# Patient Record
Sex: Male | Born: 2000 | Race: Black or African American | Hispanic: No | Marital: Single | Smoking: Current every day smoker
Health system: Southern US, Community
[De-identification: ages and names within clinical notes are randomized; demographics above are authoritative.]

## PROBLEM LIST (undated history)

## (undated) DIAGNOSIS — F4310 Post-traumatic stress disorder, unspecified: Secondary | ICD-10-CM

## (undated) DIAGNOSIS — F39 Unspecified mood [affective] disorder: Secondary | ICD-10-CM

## (undated) DIAGNOSIS — G4700 Insomnia, unspecified: Secondary | ICD-10-CM

## (undated) DIAGNOSIS — F419 Anxiety disorder, unspecified: Secondary | ICD-10-CM

## (undated) DIAGNOSIS — F909 Attention-deficit hyperactivity disorder, unspecified type: Secondary | ICD-10-CM

## (undated) DIAGNOSIS — J302 Other seasonal allergic rhinitis: Secondary | ICD-10-CM

---
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
## 2020-07-17 ENCOUNTER — Other Ambulatory Visit: Payer: Self-pay

## 2020-07-17 ENCOUNTER — Encounter (HOSPITAL_COMMUNITY): Payer: Self-pay

## 2020-07-17 ENCOUNTER — Emergency Department (HOSPITAL_COMMUNITY)
Admission: EM | Admit: 2020-07-17 | Discharge: 2020-07-17 | Disposition: A | Discharge status: Home / Self Care | Payer: Medicaid Other | Attending: Emergency Medicine | Admitting: Emergency Medicine

## 2020-07-17 DIAGNOSIS — R369 Urethral discharge, unspecified: Secondary | ICD-10-CM | POA: Insufficient documentation

## 2020-07-17 DIAGNOSIS — Z202 Contact with and (suspected) exposure to infections with a predominantly sexual mode of transmission: Secondary | ICD-10-CM | POA: Insufficient documentation

## 2020-07-17 DIAGNOSIS — F17210 Nicotine dependence, cigarettes, uncomplicated: Secondary | ICD-10-CM | POA: Diagnosis not present

## 2020-07-17 DIAGNOSIS — R300 Dysuria: Secondary | ICD-10-CM | POA: Insufficient documentation

## 2020-07-17 DIAGNOSIS — Z711 Person with feared health complaint in whom no diagnosis is made: Secondary | ICD-10-CM

## 2020-07-17 HISTORY — DX: Insomnia, unspecified: G47.00

## 2020-07-17 HISTORY — DX: Post-traumatic stress disorder, unspecified: F43.10

## 2020-07-17 LAB — URINALYSIS, ROUTINE W REFLEX MICROSCOPIC
Bilirubin Urine: NEGATIVE
Glucose, UA: NEGATIVE mg/dL
Hgb urine dipstick: NEGATIVE
Ketones, ur: NEGATIVE mg/dL
Leukocytes,Ua: NEGATIVE
Nitrite: NEGATIVE
Protein, ur: NEGATIVE mg/dL
Specific Gravity, Urine: 1.014 (ref 1.005–1.030)
pH: 6 (ref 5.0–8.0)

## 2020-07-17 MED ORDER — DOXYCYCLINE HYCLATE 100 MG PO CAPS: 100.0000 mg | ORAL_CAPSULE | Freq: Two times a day (BID) | ORAL | 0 refills | Status: AC

## 2020-07-17 MED ORDER — LIDOCAINE HCL (PF) 1 % IJ SOLN
INTRAMUSCULAR | Status: AC
  Filled 2020-07-17: qty 30

## 2020-07-17 MED ORDER — METRONIDAZOLE 500 MG PO TABS
2000.0000 mg | ORAL_TABLET | Freq: Once | ORAL | Status: AC
  Administered 2020-07-17: 2000 mg via ORAL
  Filled 2020-07-17: qty 4

## 2020-07-17 MED ORDER — CEFTRIAXONE SODIUM 1 G IJ SOLR
500.0000 mg | Freq: Once | INTRAMUSCULAR | Status: AC
  Administered 2020-07-17: 500 mg via INTRAMUSCULAR
  Filled 2020-07-17: qty 10

## 2020-07-17 NOTE — ED Triage Notes (Signed)
Patient c/o dysuria and a penile discharge yellow in color. X 2 days.

## 2020-07-17 NOTE — ED Provider Notes (Signed)
[facility]-EMERGENCY DEPT Provider Note   CSN: [id number] Arrival date & time: 07/17/20  1627     History Chief Complaint  Patient presents with  . Penile Discharge  . Dysuria    Alex Little is a 20 y.o. male.  HPI   20 year old male history of ADHD, anxiety, insomnia, mood disorder, PTSD, seasonal allergies, presents emergency department today for eval of possible STD.  States that he has had penile discharge and dysuria for the last 2 days.  He denies any penile lesions, rashes.  Denies any redness, swelling, pain to the testicles.  Denies any fevers or abdominal pain.  Has recently had unprotected intercourse and would like to be tested and treated for STDs.  Past Medical History:  Diagnosis Date  . ADHD   . Anxiety   . Insomnia   . Mood disorder (HCC)   . PTSD (post-traumatic stress disorder)   . Seasonal allergies     Patient Active Problem List   Diagnosis Date Noted  . Substance induced mood disorder (HCC) 03/01/2017    History reviewed. No pertinent surgical history.     Family History  Problem Relation Age of Onset  . Depression Mother   . Depression Father   . Schizophrenia Father     Social History   Tobacco Use  . Smoking status: Current Every Day Smoker    Packs/day: 0.25    Types: Cigarettes  . Smokeless tobacco: Never Used  Vaping Use  . Vaping Use: Some days  . Substances: Nicotine, THC, Flavoring  Substance Use Topics  . Alcohol use: Yes  . Drug use: Yes    Frequency: 7.0 times per week    Types: Marijuana    Home Medications Prior to Admission medications   Medication Sig Start Date End Date Taking? Authorizing Provider  doxycycline (VIBRAMYCIN) 100 MG capsule Take 1 capsule (100 mg total) by mouth 2 (two) times daily for 7 days. 07/17/20 07/24/20 Yes Edmond Ginsberg S, PA-C  ARIPiprazole (ABILIFY) 5 MG tablet Take 5 mg by mouth daily.    [provider]  cetirizine (ZYRTEC) 10 MG tablet Take 10 mg by  mouth daily as needed.     [provider]  divalproex (DEPAKOTE) 250 MG DR tablet Take 500 mg by mouth 2 (two) times daily.    [provider]  Guaifenesin (MUCINEX MAXIMUM STRENGTH) 1200 MG TB12 Take 1 tablet (1,200 mg total) by mouth every 12 (twelve) hours as needed. Patient taking differently: Take 1 tablet by mouth every 12 (twelve) hours as needed (congestion).  08/01/17   Maczis, Elmer Sow, PA-C  ibuprofen (ADVIL,MOTRIN) 600 MG tablet Take 1 tablet (600 mg total) by mouth every 6 (six) hours as needed. Patient taking differently: Take 600 mg by mouth every 6 (six) hours as needed for headache or moderate pain.  08/01/17   Maczis, Elmer Sow, PA-C  Methylphenidate HCl (CONCERTA PO) Take 1 tablet by mouth daily.     [provider]  traZODone (DESYREL) 100 MG tablet Take 100 mg by mouth at bedtime. 09/16/17   [provider]    Allergies    Patient has no known allergies.  Review of Systems   Review of Systems  Constitutional: Negative for fever.  Gastrointestinal: Negative for abdominal pain, nausea and vomiting.  Genitourinary: Positive for dysuria and penile discharge. Negative for frequency, genital sores, hematuria, penile pain, penile swelling, scrotal swelling, testicular pain and urgency.    Physical Exam Updated Vital Signs BP  124/77 (BP Location: Left Arm)   Pulse 84   Temp 98.9 F (37.2 C) (Oral)   Resp 16   Ht 5\' 8"  (1.727 m)   Wt 64.9 kg   SpO2 100%   BMI 21.74 kg/m   Physical Exam Constitutional:      General: He is not in acute distress.    Appearance: He is well-developed and well-nourished.  Eyes:     Conjunctiva/sclera: Conjunctivae normal.  Cardiovascular:     Rate and Rhythm: Normal rate and regular rhythm.  Pulmonary:     Effort: Pulmonary effort is normal.     Breath sounds: Normal breath sounds.  Abdominal:     General: Bowel sounds are normal.     Palpations: Abdomen is soft.     Tenderness: There is no  abdominal tenderness. There is no guarding.  Genitourinary:    Comments: GU: deferred Skin:    General: Skin is warm and dry.  Neurological:     Mental Status: He is alert and oriented to person, place, and time.     ED Results / Procedures / Treatments   Labs (all labs ordered are listed, but only abnormal results are displayed) Labs Reviewed  URINALYSIS, ROUTINE W REFLEX MICROSCOPIC - Abnormal; Notable for the following components:      Result Value   Color, Urine STRAW (*)    All other components within normal limits  GC/CHLAMYDIA PROBE AMP ([place]) NOT AT [facility]    EKG None  Radiology No results found.  Procedures Procedures   Medications Ordered in ED Medications  lidocaine (PF) (XYLOCAINE) 1 % injection (has no administration in time range)  cefTRIAXone (ROCEPHIN) injection 500 mg (500 mg Intramuscular Given 07/17/20 1802)  metroNIDAZOLE (FLAGYL) tablet 2,000 mg (2,000 mg Oral Given 07/17/20 1803)    ED Course  I have reviewed the triage vital signs and the nursing notes.  Pertinent labs & imaging results that were available during my care of the patient were reviewed by me and considered in my medical decision making (see chart for details).    MDM Rules/Calculators/A&P                          Patient is afebrile without abdominal tenderness, abdominal pain or painful bowel movements to indicate prostatitis.  No tenderness to palpation of the testes or epididymis to suggest orchitis or epididymitis.  STD cultures obtained including gonorrhea and chlamydia. I recommended HIV/RPR testing but pt refused. Patient to be discharged with instructions to follow up with PCP. Discussed importance of using protection when sexually active. Pt understands that they have GC/Chlamydia cultures pending and that they will need to inform all sexual partners if results return positive. Patient has been treated prophylactically with flagyl and Rocephin. rx for doxycycline given.    Final Clinical Impression(s) / ED Diagnoses Final diagnoses:  Concern about STD in male without diagnosis    Rx / DC Orders ED Discharge Orders         Ordered    doxycycline (VIBRAMYCIN) 100 MG capsule  2 times daily        07/17/20 [address], [place], PA-C 07/17/20 1818    09/14/20, MD 07/17/20 2024

## 2020-07-17 NOTE — Discharge Instructions (Addendum)
You were given a prescription for antibiotics. Please take the antibiotic prescription fully.   You have been tested for chlamydia and gonorrhea.  These results will be available in approximately 3 days and you will be contacted by the hospital if the results are positive. Avoid sexual contact until you are aware of the results, and please inform all sexual partners if you test positive for any of these diseases.  Please follow up with your primary care provider or the health department within 5-7 days for re-evaluation of your symptoms. If you do not have a primary care provider, information for a healthcare clinic has been provided for you to make arrangements for follow up care. Please return to the emergency department for any new or worsening symptoms.

## 2020-07-18 ENCOUNTER — Ambulatory Visit: Payer: Medicaid Other

## 2020-07-18 LAB — GC/CHLAMYDIA PROBE AMP (~~LOC~~) NOT AT ARMC
Chlamydia: NEGATIVE
Comment: NEGATIVE
Comment: NORMAL
Neisseria Gonorrhea: POSITIVE — AB

## 2020-08-27 ENCOUNTER — Emergency Department: Payer: Medicaid Other

## 2020-08-27 ENCOUNTER — Other Ambulatory Visit: Payer: Self-pay

## 2020-08-27 ENCOUNTER — Emergency Department
Admission: EM | Admit: 2020-08-27 | Discharge: 2020-08-27 | Disposition: A | Discharge status: Home / Self Care | Payer: Medicaid Other | Attending: Emergency Medicine | Admitting: Emergency Medicine

## 2020-08-27 ENCOUNTER — Encounter: Payer: Self-pay | Admitting: Emergency Medicine

## 2020-08-27 DIAGNOSIS — W2209XA Striking against other stationary object, initial encounter: Secondary | ICD-10-CM | POA: Diagnosis not present

## 2020-08-27 DIAGNOSIS — S62324A Displaced fracture of shaft of fourth metacarpal bone, right hand, initial encounter for closed fracture: Secondary | ICD-10-CM | POA: Diagnosis not present

## 2020-08-27 DIAGNOSIS — F17210 Nicotine dependence, cigarettes, uncomplicated: Secondary | ICD-10-CM | POA: Insufficient documentation

## 2020-08-27 DIAGNOSIS — M79642 Pain in left hand: Secondary | ICD-10-CM | POA: Insufficient documentation

## 2020-08-27 DIAGNOSIS — S6991XA Unspecified injury of right wrist, hand and finger(s), initial encounter: Secondary | ICD-10-CM | POA: Diagnosis present

## 2020-08-27 MED ORDER — HYDROCODONE-ACETAMINOPHEN 5-325 MG PO TABS
1.0000 | ORAL_TABLET | Freq: Once | ORAL | Status: AC
  Administered 2020-08-27: 1 via ORAL
  Filled 2020-08-27: qty 1

## 2020-08-27 MED ORDER — KETOROLAC TROMETHAMINE 60 MG/2ML IM SOLN
30.0000 mg | Freq: Once | INTRAMUSCULAR | Status: DC
  Filled 2020-08-27: qty 2

## 2020-08-27 MED ORDER — HYDROCODONE-ACETAMINOPHEN 5-325 MG PO TABS: 1.0000 | ORAL_TABLET | Freq: Four times a day (QID) | ORAL | 0 refills | Status: AC | PRN

## 2020-08-27 MED ORDER — ACETAMINOPHEN 325 MG PO TABS
650.0000 mg | ORAL_TABLET | Freq: Once | ORAL | Status: AC
  Administered 2020-08-27: 650 mg via ORAL
  Filled 2020-08-27: qty 2

## 2020-08-27 MED ORDER — MELOXICAM 15 MG PO TABS: 15.0000 mg | ORAL_TABLET | Freq: Every day | ORAL | 0 refills | Status: AC
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
## 2020-08-27 NOTE — ED Provider Notes (Signed)
[facility] Emergency Department Provider Note  ____________________________________________   Event Date/Time   First MD Initiated Contact with Patient 08/27/20 2029     (approximate)  I have reviewed the triage vital signs and the nursing notes.   HISTORY  Chief Complaint Hand Pain and Hand Injury  HPI Alex Little is a 20 y.o. male reports to the emergency department for evaluation of right hand pain.  Patient states that he got angry about something and decided to punch a tree with both of his hands.  He is complaining of bilateral hand pain, worse on the right than left.  He states that he has no significant prior surgery or injury to the right hand.  He is right-hand dominant.  No alleviating measures attempted prior to arrival.         Past Medical History:  Diagnosis Date  . ADHD   . Anxiety   . Insomnia   . Mood disorder (HCC)   . PTSD (post-traumatic stress disorder)   . Seasonal allergies     Patient Active Problem List   Diagnosis Date Noted  . Substance induced mood disorder (HCC) 03/01/2017    History reviewed. No pertinent surgical history.  Prior to Admission medications   Medication Sig Start Date End Date Taking? Authorizing Provider  HYDROcodone-acetaminophen (NORCO) 5-325 MG tablet Take 1 tablet by mouth every 6 (six) hours as needed for up to 5 days for moderate pain. 08/27/20 09/01/20 Yes Rodgers, Ruben Gottronaitlin J, PA  meloxicam (MOBIC) 15 MG tablet Take 1 tablet (15 mg total) by mouth daily for 15 days. 08/27/20 09/11/20 Yes Rodgers, Ruben Gottronaitlin J, PA  ARIPiprazole (ABILIFY) 5 MG tablet Take 5 mg by mouth daily.    [provider]  cetirizine (ZYRTEC) 10 MG tablet Take 10 mg by mouth daily as needed.     [provider]  divalproex (DEPAKOTE) 250 MG DR tablet Take 500 mg by mouth 2 (two) times daily.    [provider]  Guaifenesin (MUCINEX MAXIMUM STRENGTH) 1200 MG TB12 Take 1 tablet (1,200 mg total) by mouth  every 12 (twelve) hours as needed. Patient taking differently: Take 1 tablet by mouth every 12 (twelve) hours as needed (congestion).  08/01/17   Maczis, Elmer SowMichael M, PA-C  ibuprofen (ADVIL,MOTRIN) 600 MG tablet Take 1 tablet (600 mg total) by mouth every 6 (six) hours as needed. Patient taking differently: Take 600 mg by mouth every 6 (six) hours as needed for headache or moderate pain.  08/01/17   Maczis, Elmer SowMichael M, PA-C  Methylphenidate HCl (CONCERTA PO) Take 1 tablet by mouth daily.     [provider]  traZODone (DESYREL) 100 MG tablet Take 100 mg by mouth at bedtime. 09/16/17   [provider]    Allergies Patient has no known allergies.  Family History  Problem Relation Age of Onset  . Depression Mother   . Depression Father   . Schizophrenia Father     Social History Social History   Tobacco Use  . Smoking status: Current Every Day Smoker    Packs/day: 0.25    Types: Cigarettes  . Smokeless tobacco: Never Used  Vaping Use  . Vaping Use: Some days  . Substances: Nicotine, THC, Flavoring  Substance Use Topics  . Alcohol use: Yes    Comment: soc.  . Drug use: Yes    Frequency: 7.0 times per week    Types: Marijuana    Review of Systems Constitutional: No fever/chills Eyes: No visual  changes. ENT: No sore throat. Cardiovascular: Denies chest pain. Respiratory: Denies shortness of breath. Gastrointestinal: No abdominal pain.  No nausea, no vomiting.  No diarrhea.  No constipation. Genitourinary: Negative for dysuria. Musculoskeletal: + Bilateral hand pain, negative for back pain. Skin: Negative for rash. Neurological: Negative for headaches, focal weakness or numbness.  ____________________________________________   PHYSICAL EXAM:  VITAL SIGNS: ED Triage Vitals  Enc Vitals Group     BP 08/27/20 2008 135/88     Pulse Rate 08/27/20 2008 75     Resp 08/27/20 2008 14     Temp 08/27/20 2008 98.4 F (36.9 C)     Temp Source 08/27/20 2008 Oral      SpO2 08/27/20 2008 97 %     Weight 08/27/20 2005 150 lb (68 kg)     Height 08/27/20 2005 5\' 8"  (1.727 m)     Head Circumference --      Peak Flow --      Pain Score 08/27/20 2005 8     Pain Loc --      Pain Edu? --      Excl. in GC? --    Constitutional: Alert and oriented. Well appearing and in no acute distress. Eyes: Conjunctivae are normal. PERRL. EOMI. Head: Atraumatic. Cardiovascular: Normal rate, regular rhythm. Grossly normal heart sounds.  Good peripheral circulation. Respiratory: Normal respiratory effort.  No retractions. Lungs CTAB. Musculoskeletal: There is mild tenderness to the generalized left hand-nonspecific over any 1 specific area.  No soft tissue swelling appreciated able to move all digits of the left hand without difficulty.  No anatomic snuffbox or carpal bone tenderness.  There is deformity noted to the right hand along the fourth/fifth metacarpal bone region.  Significant tenderness and soft tissue swelling appreciated.  Patient is able to initiate active flexion extension of the fourth and fifth digits though range of motion significantly limited by pain.  Radial pulse 2+ bilaterally, capillary refill less than 3 seconds all digits.  No open wounds appreciated. Neurologic:  Normal speech and language. No gross focal neurologic deficits are appreciated. No gait instability. Skin:  Skin is warm, dry and intact. No rash noted. Psychiatric: Mood and affect are normal. Speech and behavior are normal.  ____________________________________________  RADIOLOGY I, 08/29/20, personally viewed and evaluated these images (plain radiographs) as part of my medical decision making, as well as reviewing the written report by the radiologist.  ED provider interpretation: X-ray of the left hand reveals no acute fracture.  X-ray of the right hand reveals a midshaft fourth metacarpal fracture.  Angulation is present to approximately 40 degrees.  Official radiology  report(s): DG Hand Complete Left  Result Date: 08/27/2020 CLINICAL DATA:  Injury.  Bilateral hand pain EXAM: LEFT HAND - COMPLETE 3+ VIEW COMPARISON:  None. FINDINGS: There is no evidence of fracture or dislocation. There is no evidence of arthropathy or other focal bone abnormality. Soft tissues are unremarkable. IMPRESSION: Negative. Electronically Signed   By: 08/29/2020 M.D.   On: 08/27/2020 20:30   DG Hand Complete Right  Result Date: 08/27/2020 CLINICAL DATA:  Hand injury today EXAM: RIGHT HAND - COMPLETE 3+ VIEW COMPARISON:  None. FINDINGS: Transverse fracture mid shaft of fourth metacarpal with mild angulation. No other fracture or arthropathy. IMPRESSION: Angulated fracture right fourth metacarpal. Electronically Signed   By: 08/29/2020 M.D.   On: 08/27/2020 20:29    ____________________________________________   PROCEDURES  Procedure(s) performed (including Critical Care):  .Ortho Injury Treatment  Date/Time:  08/27/2020 11:21 PM Performed by: Lucy Chris, PA Authorized by: Lucy Chris, PA   Consent:    Consent obtained:  Verbal   Consent given by:  Patient   Risks discussed:  Restricted joint movement   Alternatives discussed:  No treatment and referralInjury location: hand Location details: right hand Injury type: fracture Fracture type: fourth metacarpal Pre-procedure neurovascular assessment: neurovascularly intact Pre-procedure distal perfusion: normal Pre-procedure neurological function: normal Pre-procedure range of motion: reduced  Anesthesia: Local anesthesia used: no  Patient sedated: NoManipulation performed: no Immobilization: splint Splint type: ulnar gutter Splint Applied by: ED Nurse Supplies used: cotton padding,  elastic bandage and Ortho-Glass Post-procedure neurovascular assessment: post-procedure neurovascularly intact Post-procedure distal perfusion: normal Post-procedure neurological function: normal Post-procedure  range of motion: unchanged      ____________________________________________   INITIAL IMPRESSION / ASSESSMENT AND PLAN / ED COURSE  As part of my medical decision making, I reviewed the following data within the electronic MEDICAL RECORD NUMBER Nursing notes reviewed and incorporated, Radiograph reviewed  A consult was requested and obtained from this/these consultant(s) Orthopedics, Notes from prior ED visits and [place] Controlled Substance Database        Patient is a 20 year old male who presents to the emergency department for evaluation of bilateral hand pain after he punched a tree in anger.  See HPI for further details.  In triage, patient has normal vital signs.  On physical exam, patient has mild diffuse tenderness of the left hand without any specific deformity, crepitus.  There is deformity noted to the metacarpals of the right hand with significant soft tissue swelling and tenderness.  He is neurovascularly intact bilaterally.  X-rays were obtained of both hands, shows a fourth metacarpal fracture on the right hand with approximately 40 degrees of angulation.  Given the amount of angulation, Dr. Odis Luster of orthopedics was consulted.  He recommends placing the patient in an ulnar gutter splint with outpatient orthopedic follow-up.  The patient was placed at this time in an ulnar gutter splint by ED nurse.  Patient will be prescribed anti-inflammatory as well as Norco for pain.  Precautions were discussed regarding the Norco.  Patient also advised to take additional 650 mg of Tylenol with this.  Patient amenable with plan, stable this time for outpatient follow-up.      ____________________________________________   FINAL CLINICAL IMPRESSION(S) / ED DIAGNOSES  Final diagnoses:  Closed displaced fracture of shaft of fourth metacarpal bone of right hand, initial encounter     ED Discharge Orders         Ordered    HYDROcodone-acetaminophen (NORCO) 5-325 MG tablet  Every 6 hours PRN         08/27/20 2049    meloxicam (MOBIC) 15 MG tablet  Daily        08/27/20 2049          *Please note:  Edgerrin Correia was evaluated in Emergency Department on 08/27/2020 for the symptoms described in the history of present illness. He was evaluated in the context of the global COVID-19 pandemic, which necessitated consideration that the patient might be at risk for infection with the SARS-CoV-2 virus that causes COVID-19. Institutional protocols and algorithms that pertain to the evaluation of patients at risk for COVID-19 are in a state of rapid change based on information released by regulatory bodies including the CDC and federal and state organizations. These policies and algorithms were followed during the patient's care in the ED.  Some ED evaluations and interventions may  be delayed as a result of limited staffing during and the pandemic.*   Note:  This document was prepared using Dragon voice recognition software and may include unintentional dictation errors.   Lucy Chris, PA 08/27/20 8850    Minna Antis, MD 08/28/20 1515

## 2020-08-27 NOTE — Discharge Instructions (Addendum)
Please use the Mobic prescribed once daily.  Please also take Tylenol, up to 650 mg 4 times daily.  You were also prescribed Norco, a narcotic pain medication that you may use up to 4 times daily for breakthrough pain.  Please keep the splint applied at all times until follow-up with orthopedics.

## 2020-11-26 ENCOUNTER — Other Ambulatory Visit: Payer: Self-pay

## 2020-11-26 ENCOUNTER — Ambulatory Visit (INDEPENDENT_AMBULATORY_CARE_PROVIDER_SITE_OTHER): Payer: Medicaid Other

## 2020-11-26 ENCOUNTER — Ambulatory Visit (HOSPITAL_COMMUNITY)
Admission: EM | Admit: 2020-11-26 | Discharge: 2020-11-26 | Disposition: A | Discharge status: Home / Self Care | Payer: Medicaid Other | Attending: Medical Oncology | Admitting: Medical Oncology

## 2020-11-26 ENCOUNTER — Encounter (HOSPITAL_COMMUNITY): Payer: Self-pay

## 2020-11-26 DIAGNOSIS — M79641 Pain in right hand: Secondary | ICD-10-CM

## 2020-11-26 NOTE — ED Provider Notes (Signed)
[facility]    CSN: [id number] Arrival date & time: 11/26/20  1329      History   Chief Complaint Chief Complaint  Patient presents with   Hand Pain    HPI Alex Little is a 20 y.o. male.   HPI  Hand Pain: Patient was diagnosed with a closed displaced fracture of shaft of fourth metacarpal bone of right hand on 08/27/2020.  He appeared neurovascularly intact according to their notes. He was initially placed in ulnar gutter splint according to the ED notes and had outpatient orthopedic follow-up.  It appears that he had a follow-up scheduled on 09/02/2020 but it does not appear that he attended.  It appears that he has had lost to follow-up since. Per patient he attended the follow up where he was given a brace but does not recall getting repeated imaging. He reports that the hand was feeling "almost back to normal" when he "got angry" and punched a tree. Pain immediately returned at the time of this event. Now only painful when he makes a fist or grips something. No neuro changes, numbness/tingling.    Past Medical History:  Diagnosis Date   ADHD    Anxiety    Insomnia    Mood disorder (HCC)    PTSD (post-traumatic stress disorder)    Seasonal allergies     Patient Active Problem List   Diagnosis Date Noted   Substance induced mood disorder (HCC) 03/01/2017    History reviewed. No pertinent surgical history.     Home Medications    Prior to Admission medications   Medication Sig Start Date End Date Taking? Authorizing Provider  ARIPiprazole (ABILIFY) 5 MG tablet Take 5 mg by mouth daily.    [provider]  cetirizine (ZYRTEC) 10 MG tablet Take 10 mg by mouth daily as needed.     [provider]  divalproex (DEPAKOTE) 250 MG DR tablet Take 500 mg by mouth 2 (two) times daily.    [provider]  Guaifenesin (MUCINEX MAXIMUM STRENGTH) 1200 MG TB12 Take 1 tablet (1,200 mg total) by mouth every 12 (twelve) hours as needed. Patient  taking differently: Take 1 tablet by mouth every 12 (twelve) hours as needed (congestion). 08/01/17   Maczis, Elmer Sow, PA-C  ibuprofen (ADVIL,MOTRIN) 600 MG tablet Take 1 tablet (600 mg total) by mouth every 6 (six) hours as needed. Patient taking differently: Take 600 mg by mouth every 6 (six) hours as needed for headache or moderate pain. 08/01/17   Maczis, Elmer Sow, PA-C  Methylphenidate HCl (CONCERTA PO) Take 1 tablet by mouth daily.     [provider]  traZODone (DESYREL) 100 MG tablet Take 100 mg by mouth at bedtime. 09/16/17   [provider]    Family History Family History  Problem Relation Age of Onset   Depression Mother    Depression Father    Schizophrenia Father     Social History Social History   Tobacco Use   Smoking status: Every Day    Packs/day: 0.25    Pack years: 0.00    Types: Cigarettes   Smokeless tobacco: Never  Vaping Use   Vaping Use: Some days   Substances: Nicotine, THC, Flavoring  Substance Use Topics   Alcohol use: Yes    Comment: soc.   Drug use: Yes    Frequency: 7.0 times per week    Types: Marijuana     Allergies   Patient has no known allergies.   Review  of Systems Review of Systems  As stated above in HPI Physical Exam Triage Vital Signs ED Triage Vitals  Enc Vitals Group     BP 11/26/20 1432 133/70     Pulse Rate 11/26/20 1432 62     Resp 11/26/20 1432 15     Temp 11/26/20 1432 99 F (37.2 C)     Temp Source 11/26/20 1432 Oral     SpO2 11/26/20 1432 100 %     Weight --      Height --      Head Circumference --      Peak Flow --      Pain Score 11/26/20 1430 8     Pain Loc --      Pain Edu? --      Excl. in GC? --    No data found.  Updated Vital Signs BP 133/70 (BP Location: Right Arm)   Pulse 62   Temp 99 F (37.2 C) (Oral)   Resp 15   SpO2 100%   Physical Exam Vitals and nursing note reviewed.  Constitutional:      Appearance: Normal appearance.  Cardiovascular:     Pulses: Normal  pulses.  Musculoskeletal:        General: Swelling and tenderness present.       Arms:  Skin:    General: Skin is warm.     Findings: No bruising, erythema or rash.  Neurological:     Mental Status: He is alert.     UC Treatments / Results  Labs (all labs ordered are listed, but only abnormal results are displayed) Labs Reviewed - No data to display  EKG   Radiology DG Hand Complete Right  Result Date: 11/26/2020 CLINICAL DATA:  Right hand pain. Injury. Recent fracture in April of 2022, question Ree fracture. EXAM: RIGHT HAND - COMPLETE 3+ VIEW COMPARISON:  Radiograph 08/27/2020 FINDINGS: Fourth metacarpal fracture has surrounding callus formation with mild apex dorsal angulation. The fracture line remains visible about the dorsal aspect. Ii is unclear if this represents incomplete healing or re-fracture through the old fracture site, there are no interval exams available. No additional or new fracture. Otherwise normal alignment. Normal joint spaces. IMPRESSION: Prior fourth metacarpal fracture has mild apex dorsal angulation. There is surrounding callus formation, however the fracture line remains visible about the dorsal aspect. It is unclear if this represents incomplete fracture healing or re-fracture through the old fracture site, there are no interval exams available since August 27, 2020. Electronically Signed   By: Narda Rutherford M.D.   On: 11/26/2020 15:11    Procedures Procedures (including critical care time)  Medications Ordered in UC Medications - No data to display  Initial Impression / Assessment and Plan / UC Course  I have reviewed the triage vital signs and the nursing notes.  Pertinent labs & imaging results that were available during my care of the patient were reviewed by me and considered in my medical decision making (see chart for details).     New. Given x ray findings this appears to be an acute on chronic fracture. Will place back in splint and have  him follow up with orthopedics. Discussed the importance of orthopedic follow up. Pain is well controlled per patient.    Final Clinical Impressions(s) / UC Diagnoses   Final diagnoses:  Right hand pain   Discharge Instructions   None    ED Prescriptions   None    PDMP not reviewed this encounter.  Rushie Chestnut, [place] 11/26/20 1535

## 2020-11-26 NOTE — ED Triage Notes (Signed)
Pt presents with pain in the right hand x 2 1/2 week. Pain is worse when lighting heavy objects. Pt reports he had a fracture in the right hand on April 2022.

## 2021-09-07 ENCOUNTER — Telehealth: Payer: Medicaid Other | Admitting: Family Medicine

## 2021-09-07 DIAGNOSIS — K0889 Other specified disorders of teeth and supporting structures: Secondary | ICD-10-CM | POA: Diagnosis not present

## 2021-09-07 MED ORDER — IBUPROFEN 600 MG PO TABS: 600.0000 mg | ORAL_TABLET | Freq: Three times a day (TID) | ORAL | 0 refills | Status: AC | PRN

## 2021-09-07 MED ORDER — AMOXICILLIN 500 MG PO CAPS: 500.0000 mg | ORAL_CAPSULE | Freq: Three times a day (TID) | ORAL | 0 refills | Status: AC

## 2021-09-07 NOTE — Progress Notes (Signed)
?Virtual Visit Consent  ? ?Alex Little, you are scheduled for a virtual visit with a [facility] provider today.   ?  ?Just as with appointments in the office, your consent must be obtained to participate.  Your consent will be active for this visit and any virtual visit you may have with one of our providers in the next 365 days.   ?  ?If you have a MyChart account, a copy of this consent can be sent to you electronically.  All virtual visits are billed to your insurance company just like a traditional visit in the office.   ? ?As this is a virtual visit, video technology does not allow for your provider to perform a traditional examination.  This may limit your provider's ability to fully assess your condition.  If your provider identifies any concerns that need to be evaluated in person or the need to arrange testing (such as labs, EKG, etc.), we will make arrangements to do so.   ?  ?Although advances in technology are sophisticated, we cannot ensure that it will always work on either your end or our end.  If the connection with a video visit is poor, the visit may have to be switched to a telephone visit.  With either a video or telephone visit, we are not always able to ensure that we have a secure connection.    ? ?I need to obtain your verbal consent now.   Are you willing to proceed with your visit today?  ?  ?Alex Little has provided verbal consent on 09/07/2021 for a virtual visit (video or telephone). ?  ?Alex Finner, Alex Little  ? ?Date: 09/07/2021 11:05 AM ? ? ?Virtual Visit via Video Note  ? ?Alex Little, connected with  Alex Little  ([id number], Jan 12, 2001) on 09/07/21 at 11:15 AM EDT by a video-enabled telemedicine application and verified that I am speaking with the correct person using two identifiers. ? ?Location: ?Patient: Virtual Visit Location Patient: Home ?Provider: Virtual Visit Location Provider: Home Office ?  ?I discussed the limitations of evaluation and management by telemedicine and  the availability of in person appointments. The patient expressed understanding and agreed to proceed.   ? ?History of Present Illness: ?Alex Little is a 21 y.o. who identifies as a male who was assigned male at birth, and is being seen today for infected tooth pain that started yesterday. Back right wisdom tooth on the bottom. He thinks they are impacted. He reports pain with chewing, opening and closing mouth pain. Pain with swallowing, but no trouble getting food or drink down. He denies swelling of throat, jaw/jaw line area. ?Denies fevers, chills, n/v.  ? ?Problems:  ?Patient Active Problem List  ? Diagnosis Date Noted  ? Substance induced mood disorder (HCC) 03/01/2017  ?  ?Allergies: No Known Allergies ?Medications:  ?Current Outpatient Medications:  ?  ARIPiprazole (ABILIFY) 5 MG tablet, Take 5 mg by mouth daily., Disp: , Rfl:  ?  cetirizine (ZYRTEC) 10 MG tablet, Take 10 mg by mouth daily as needed. , Disp: , Rfl:  ?  divalproex (DEPAKOTE) 250 MG DR tablet, Take 500 mg by mouth 2 (two) times daily., Disp: , Rfl:  ?  Guaifenesin (MUCINEX MAXIMUM STRENGTH) 1200 MG TB12, Take 1 tablet (1,200 mg total) by mouth every 12 (twelve) hours as needed. (Patient taking differently: Take 1 tablet by mouth every 12 (twelve) hours as needed (congestion).), Disp: 14 tablet, Rfl: 1 ?  ibuprofen (ADVIL,MOTRIN) 600 MG tablet,  Take 1 tablet (600 mg total) by mouth every 6 (six) hours as needed. (Patient taking differently: Take 600 mg by mouth every 6 (six) hours as needed for headache or moderate pain.), Disp: 30 tablet, Rfl: 0 ?  Methylphenidate HCl (CONCERTA PO), Take 1 tablet by mouth daily. , Disp: , Rfl:  ?  traZODone (DESYREL) 100 MG tablet, Take 100 mg by mouth at bedtime., Disp: , Rfl: 0 ? ?Observations/Objective: ?Patient is well-developed, well-nourished in no acute distress.  ?Resting comfortably  at home.  ?Head is normocephalic, atraumatic.  ?No labored breathing.  ?Speech is clear and coherent with logical  content.  ?Patient is alert and oriented at baseline.  ? ? ?Assessment and Plan: ? ?1. Pain in tooth ? ?- ibuprofen (ADVIL) 600 MG tablet; Take 1 tablet (600 mg total) by mouth every 8 (eight) hours as needed.  Dispense: 30 tablet; Refill: 0 ?- amoxicillin (AMOXIL) 500 MG capsule; Take 1 capsule (500 mg total) by mouth 3 (three) times daily for 10 days.  Dispense: 30 capsule; Refill: 0 ? ?S&S consistent with possible tooth infection ?Advised in person eval if 48 hours post anbx start didn't show improvement in symptoms ? ? Reviewed side effects, risks and benefits of medication.   ? ? ?Patient acknowledged agreement and understanding of the plan.  ? ? ?Follow Up Instructions: ?I discussed the assessment and treatment plan with the patient. The patient was provided an opportunity to ask questions and all were answered. The patient agreed with the plan and demonstrated an understanding of the instructions.  A copy of instructions were sent to the patient via MyChart unless otherwise noted below.  ? ? ?The patient was advised to call back or seek an in-person evaluation if the symptoms worsen or if the condition fails to improve as anticipated. ? ?Time:  ?I spent 10 minutes with the patient via telehealth technology discussing the above problems/concerns.   ? ?Alex Finner, Alex Little ? ? ? ? ?

## 2021-09-07 NOTE — Patient Instructions (Signed)
t is imperative that you see a dentist within 10 days of this eVisit to determine the cause of the dental pain and be sure it is adequately treated ? ?A toothache or tooth pain is caused when the nerve in the root of a tooth or surrounding a tooth is irritated. Dental (tooth) infection, decay, injury, or loss of a tooth are the most common causes of dental pain. Pain may also occur after an extraction (tooth is pulled out). Pain sometimes originates from other areas and radiates to the jaw, thus appearing to be tooth pain.Bacteria growing inside your mouth can contribute to gum disease and dental decay, both of which can cause pain. A toothache occurs from inflammation of the central portion of the tooth called pulp. The pulp contains nerve endings that are very sensitive to pain. Inflammation to the pulp or pulpitis may be caused by dental cavities, trauma, and infection.  ? ? ?HOME CARE:  ? ?For toothaches: ?Over-the-counter pain medications such as acetaminophen or ibuprofen may be used. Take these as directed on the package while you arrange for a dental appointment. ?Avoid very cold or hot foods, because they may make the pain worse. ?You may get relief from biting on a cotton ball soaked in oil of cloves. You can get oil of cloves at most drug stores. ? ?For jaw pain: ? Aspirin may be helpful for problems in the joint of the jaw in adults. ?If pain happens every time you open your mouth widely, the temporomandibular joint (TMJ) may be the source of the pain. Yawning or taking a large bite of food may worsen the pain. An appointment with your doctor or dentist will help you find the cause. ?  ? ? ?GET HELP RIGHT AWAY IF: ? ?You have a high fever or chills ?If you have had a recent head or face injury and develop headache, light headedness, nausea, vomiting, or other symptoms that concern you after an injury to your face or mouth, you could have a more serious injury in addition to your dental injury. ?A facial  rash associated with a toothache: This condition may improve with medication. Contact your doctor for them to decide what is appropriate. ?Any jaw pain occurring with chest pain: Although jaw pain is most commonly caused by dental disease, it is sometimes referred pain from other areas. People with heart disease, especially people who have had stents placed, people with diabetes, or those who have had heart surgery may have jaw pain as a symptom of heart attack or angina. If your jaw or tooth pain is associated with lightheadedness, sweating, or shortness of breath, you should see a doctor as soon as possible. ?Trouble swallowing or excessive pain or bleeding from gums: If you have a history of a weakened immune system, diabetes, or steroid use, you may be more susceptible to infections. Infections can often be more severe and extensive or caused by unusual organisms. Dental and gum infections in people with these conditions may require more aggressive treatment. An abscess may need draining or IV antibiotics, for example. ? ?MAKE SURE YOU  ? ?Understand these instructions. ?Will watch your condition. ?Will get help right away if you are not doing well or get worse. ? ?

## 2021-10-14 ENCOUNTER — Telehealth: Payer: Medicaid Other | Admitting: Physician Assistant

## 2021-10-14 ENCOUNTER — Encounter: Payer: Self-pay | Admitting: Physician Assistant

## 2021-10-14 DIAGNOSIS — J069 Acute upper respiratory infection, unspecified: Secondary | ICD-10-CM | POA: Diagnosis not present

## 2021-10-14 MED ORDER — IPRATROPIUM BROMIDE 0.03 % NA SOLN: 2.0000 | Freq: Two times a day (BID) | NASAL | 0 refills | Status: DC

## 2021-10-14 MED ORDER — COVID-19 AT-HOME TEST VI KIT: PACK | 0 refills | Status: AC

## 2021-10-14 MED ORDER — PSEUDOEPH-BROMPHEN-DM 30-2-10 MG/5ML PO SYRP: 5.0000 mL | ORAL_SOLUTION | Freq: Four times a day (QID) | ORAL | 0 refills | Status: DC | PRN

## 2021-10-14 NOTE — Progress Notes (Signed)
?Virtual Visit Consent  ? ?Alex Little, you are scheduled for a virtual visit with a [facility] provider today. Just as with appointments in the office, your consent must be obtained to participate. Your consent will be active for this visit and any virtual visit you may have with one of our providers in the next 365 days. If you have a MyChart account, a copy of this consent can be sent to you electronically. ? ?As this is a virtual visit, video technology does not allow for your provider to perform a traditional examination. This may limit your provider's ability to fully assess your condition. If your provider identifies any concerns that need to be evaluated in person or the need to arrange testing (such as labs, EKG, etc.), we will make arrangements to do so. Although advances in technology are sophisticated, we cannot ensure that it will always work on either your end or our end. If the connection with a video visit is poor, the visit may have to be switched to a telephone visit. With either a video or telephone visit, we are not always able to ensure that we have a secure connection. ? ?By engaging in this virtual visit, you consent to the provision of healthcare and authorize for your insurance to be billed (if applicable) for the services provided during this visit. Depending on your insurance coverage, you may receive a charge related to this service. ? ?I need to obtain your verbal consent now. Are you willing to proceed with your visit today? Alex Little has provided verbal consent on 10/14/2021 for a virtual visit (video or telephone). Mar Daring, PA-C ? ?Date: 10/14/2021 11:37 AM ? ?Virtual Visit via Video Note  ? ?Alex Little, connected with  Alex Little  ([id number], 2023/08/21) on 10/14/21 at 11:15 AM EDT by a video-enabled telemedicine application and verified that I am speaking with the correct person using two identifiers. ? ?Location: ?Patient: Virtual Visit Location  Patient: Home ?Provider: Virtual Visit Location Provider: Home Office ?  ?I discussed the limitations of evaluation and management by telemedicine and the availability of in person appointments. The patient expressed understanding and agreed to proceed.   ? ?History of Present Illness: ?Alex Little is a 21 y.o. who identifies as a male who was assigned male at birth, and is being seen today for URI symptoms. ? ?HPI: URI  ?This is a new problem. The current episode started in the past 7 days (2 days ago). The problem has been gradually worsening. There has been no fever. Associated symptoms include congestion (off and on), coughing, diarrhea, headaches and a sore throat. Pertinent negatives include no ear pain, nausea, plugged ear sensation, rhinorrhea, sinus pain, sneezing or vomiting. Associated symptoms comments: Hot and cold flashes, lack of appetite. He has tried nothing for the symptoms. The treatment provided no relief.   ? ? ?Problems:  ?Patient Active Problem List  ? Diagnosis Date Noted  ? Substance induced mood disorder ([place]) 03/01/2017  ?  ?Allergies: No Known Allergies ?Medications:  ?Current Outpatient Medications:  ?  brompheniramine-pseudoephedrine-DM 30-2-10 MG/5ML syrup, Take 5 mLs by mouth 4 (four) times daily as needed., Disp: 120 mL, Rfl: 0 ?  COVID-19 At-Home Test KIT, Use as directed on package instructions, Disp: 1 kit, Rfl: 0 ?  ipratropium (ATROVENT) 0.03 % nasal spray, Place 2 sprays into both nostrils every 12 (twelve) hours., Disp: 30 mL, Rfl: 0 ?  ARIPiprazole (ABILIFY) 5 MG tablet, Take 5 mg by mouth daily.,  Disp: , Rfl:  ?  cetirizine (ZYRTEC) 10 MG tablet, Take 10 mg by mouth daily as needed. , Disp: , Rfl:  ?  divalproex (DEPAKOTE) 250 MG DR tablet, Take 500 mg by mouth 2 (two) times daily., Disp: , Rfl:  ?  Guaifenesin (MUCINEX MAXIMUM STRENGTH) 1200 MG TB12, Take 1 tablet (1,200 mg total) by mouth every 12 (twelve) hours as needed. (Patient taking differently: Take 1 tablet by  mouth every 12 (twelve) hours as needed (congestion).), Disp: 14 tablet, Rfl: 1 ?  ibuprofen (ADVIL) 600 MG tablet, Take 1 tablet (600 mg total) by mouth every 8 (eight) hours as needed., Disp: 30 tablet, Rfl: 0 ?  Methylphenidate HCl (CONCERTA PO), Take 1 tablet by mouth daily. , Disp: , Rfl:  ?  traZODone (DESYREL) 100 MG tablet, Take 100 mg by mouth at bedtime., Disp: , Rfl: 0 ? ?Observations/Objective: ?Patient is well-developed, well-nourished in no acute distress.  ?Resting comfortably at home.  ?Head is normocephalic, atraumatic.  ?No labored breathing.  ?Speech is clear and coherent with logical content.  ?Patient is alert and oriented at baseline.  ? ? ?Assessment and Plan: ?1. Viral URI with cough ?- ipratropium (ATROVENT) 0.03 % nasal spray; Place 2 sprays into both nostrils every 12 (twelve) hours.  Dispense: 30 mL; Refill: 0 ?- brompheniramine-pseudoephedrine-DM 30-2-10 MG/5ML syrup; Take 5 mLs by mouth 4 (four) times daily as needed.  Dispense: 120 mL; Refill: 0 ? ?- Worsening symptoms that have not responded to OTC medications.  ?- Will give Bromfed DM and Ipratropium nasal spray ?- Continue allergy medications.  ?- Steam and humidifier can help ?- Stay well hydrated and get plenty of rest.  ?- Seek in person evaluation if no symptom improvement or if symptoms worsen ? ?Follow Up Instructions: ?I discussed the assessment and treatment plan with the patient. The patient was provided an opportunity to ask questions and all were answered. The patient agreed with the plan and demonstrated an understanding of the instructions.  A copy of instructions were sent to the patient via MyChart unless otherwise noted below.  ? ? ?The patient was advised to call back or seek an in-person evaluation if the symptoms worsen or if the condition fails to improve as anticipated. ? ?Time:  ?I spent 15 minutes with the patient via telehealth technology discussing the above problems/concerns.   ? ?Mar Daring,  PA-C ?

## 2021-10-14 NOTE — Patient Instructions (Signed)
?Biagio Quint, thank you for joining Mar Daring, PA-C for today's virtual visit.  While this provider is not your primary care provider (PCP), if your PCP is located in our provider database this encounter information will be shared with them immediately following your visit. ? ?Consent: ?(Patient) Alex Little provided verbal consent for this virtual visit at the beginning of the encounter. ? ?Current Medications: ? ?Current Outpatient Medications:  ?  brompheniramine-pseudoephedrine-DM 30-2-10 MG/5ML syrup, Take 5 mLs by mouth 4 (four) times daily as needed., Disp: 120 mL, Rfl: 0 ?  COVID-19 At-Home Test KIT, Use as directed on package instructions, Disp: 1 kit, Rfl: 0 ?  ipratropium (ATROVENT) 0.03 % nasal spray, Place 2 sprays into both nostrils every 12 (twelve) hours., Disp: 30 mL, Rfl: 0 ?  ARIPiprazole (ABILIFY) 5 MG tablet, Take 5 mg by mouth daily., Disp: , Rfl:  ?  cetirizine (ZYRTEC) 10 MG tablet, Take 10 mg by mouth daily as needed. , Disp: , Rfl:  ?  divalproex (DEPAKOTE) 250 MG DR tablet, Take 500 mg by mouth 2 (two) times daily., Disp: , Rfl:  ?  Guaifenesin (MUCINEX MAXIMUM STRENGTH) 1200 MG TB12, Take 1 tablet (1,200 mg total) by mouth every 12 (twelve) hours as needed. (Patient taking differently: Take 1 tablet by mouth every 12 (twelve) hours as needed (congestion).), Disp: 14 tablet, Rfl: 1 ?  ibuprofen (ADVIL) 600 MG tablet, Take 1 tablet (600 mg total) by mouth every 8 (eight) hours as needed., Disp: 30 tablet, Rfl: 0 ?  Methylphenidate HCl (CONCERTA PO), Take 1 tablet by mouth daily. , Disp: , Rfl:  ?  traZODone (DESYREL) 100 MG tablet, Take 100 mg by mouth at bedtime., Disp: , Rfl: 0  ? ?Medications ordered in this encounter:  ?Meds ordered this encounter  ?Medications  ? ipratropium (ATROVENT) 0.03 % nasal spray  ?  Sig: Place 2 sprays into both nostrils every 12 (twelve) hours.  ?  Dispense:  30 mL  ?  Refill:  0  ?  Order Specific Question:   Supervising Provider  ?  Answer:    Noemi Chapel [[id number]]  ? brompheniramine-pseudoephedrine-DM 30-2-10 MG/5ML syrup  ?  Sig: Take 5 mLs by mouth 4 (four) times daily as needed.  ?  Dispense:  120 mL  ?  Refill:  0  ?  Order Specific Question:   Supervising Provider  ?  Answer:   Noemi Chapel [[id number]]  ? COVID-19 At-Home Test KIT  ?  Sig: Use as directed on package instructions  ?  Dispense:  1 kit  ?  Refill:  0  ?  Order Specific Question:   Supervising Provider  ?  Answer:   Noemi Chapel [[id number]]  ?  ? ?*If you need refills on other medications prior to your next appointment, please contact your pharmacy* ? ?Follow-Up: ?Call back or seek an in-person evaluation if the symptoms worsen or if the condition fails to improve as anticipated. ? ?Other Instructions ? ?Upper Respiratory Infection, Adult ?An upper respiratory infection (URI) affects the nose, throat, and upper airways that lead to the lungs. The most common type of URI is often called the common cold. URIs usually get better on their own, without medical treatment. ?What are the causes? ?A URI is caused by a germ (virus). You may catch these germs by: ?Breathing in droplets from an infected person's cough or sneeze. ?Touching something that has the germ on it (is contaminated) and then touching your mouth, nose, or  eyes. ?What increases the risk? ?You are more likely to get a URI if: ?You are very young or very old. ?You have close contact with others, such as at work, school, or a health care facility. ?You smoke. ?You have long-term (chronic) heart or lung disease. ?You have a weakened disease-fighting system (immune system). ?You have nasal allergies or asthma. ?You have a lot of stress. ?You have poor nutrition. ?What are the signs or symptoms? ?Runny or stuffy (congested) nose. ?Cough. ?Sneezing. ?Sore throat. ?Headache. ?Feeling tired (fatigue). ?Fever. ?Not wanting to eat as much as usual. ?Pain in your forehead, behind your eyes, and over your cheekbones (sinus pain). ?Muscle  aches. ?Redness or irritation of the eyes. ?Pressure in the ears or face. ?How is this treated? ?URIs usually get better on their own within 7-10 days. Medicines cannot cure URIs, but your doctor may recommend certain medicines to help relieve symptoms, such as: ?Over-the-counter cold medicines. ?Medicines to reduce coughing (cough suppressants). Coughing is a type of defense against infection that helps to clear the nose, throat, windpipe, and lungs (respiratory system). Take these medicines only as told by your doctor. ?Medicines to lower your fever. ?Follow these instructions at home: ?Activity ?Rest as needed. ?If you have a fever, stay home from work or school until your fever is gone, or until your doctor says you may return to work or school. ?You should stay home until you cannot spread the infection anymore (you are not contagious). ?Your doctor may have you wear a face mask so you have less risk of spreading the infection. ?Relieving symptoms ?Rinse your mouth often with salt water. To make salt water, dissolve ?-1 tsp (3-6 g) of salt in 1 cup (237 mL) of warm water. ?Use a cool-mist humidifier to add moisture to the air. This can help you breathe more easily. ?Eating and drinking ? ?Drink enough fluid to keep your pee (urine) pale yellow. ?Eat soups and other clear broths. ?General instructions ? ?Take over-the-counter and prescription medicines only as told by your doctor. ?Do not smoke or use any products that contain nicotine or tobacco. If you need help quitting, ask your doctor. ?Avoid being where people are smoking (avoid secondhand smoke). ?Stay up to date on all your shots (immunizations), and get the flu shot every year. ?Keep all follow-up visits. ?How to prevent the spread of infection to others ? ?Wash your hands with soap and water for at least 20 seconds. If you cannot use soap and water, use hand sanitizer. ?Avoid touching your mouth, face, eyes, or nose. ?Cough or sneeze into a tissue or  your sleeve or elbow. Do not cough or sneeze into your hand or into the air. ?Contact a doctor if: ?You are getting worse, not better. ?You have any of these: ?A fever or chills. ?Brown or red mucus in your nose. ?Yellow or brown fluid (discharge)coming from your nose. ?Pain in your face, especially when you bend forward. ?Swollen neck glands. ?Pain when you swallow. ?White areas in the back of your throat. ?Get help right away if: ?You have shortness of breath that gets worse. ?You have very bad or constant: ?Headache. ?Ear pain. ?Pain in your forehead, behind your eyes, and over your cheekbones (sinus pain). ?Chest pain. ?You have long-lasting (chronic) lung disease along with any of these: ?Making high-pitched whistling sounds when you breathe, most often when you breathe out (wheezing). ?Long-lasting cough (more than 14 days). ?Coughing up blood. ?A change in your usual mucus. ?You have  a stiff neck. ?You have changes in your: ?Vision. ?Hearing. ?Thinking. ?Mood. ?These symptoms may be an emergency. Get help right away. Call 911. ?Do not wait to see if the symptoms will go away. ?Do not drive yourself to the hospital. ?Summary ?An upper respiratory infection (URI) is caused by a germ (virus). The most common type of URI is often called the common cold. ?URIs usually get better within 7-10 days. ?Take over-the-counter and prescription medicines only as told by your doctor. ?This information is not intended to replace advice given to you by your health care provider. Make sure you discuss any questions you have with your health care provider. ?Document Revised: 12/24/2020 Document Reviewed: 12/24/2020 ?Elsevier Patient Education ? [place]. ? ? ? ?If you have been instructed to have an in-person evaluation today at a local Urgent Care facility, please use the link below. It will take you to a list of all of our available [facility], including address, phone number and hours of operation.  Please do not delay care.  ?[facility] ? ?If you or a family member do not have a primary care provider, use the link below to schedule a visit and establish care. When you choose a [facility] primary

## 2021-11-04 IMAGING — CR DG HAND COMPLETE 3+V*L*
1 series · 3 of 3 positions shown · non-contrast
Comparison: None.

CLINICAL DATA: Injury.  Bilateral hand pain

EXAM:
LEFT HAND - COMPLETE 3+ VIEW

[Series 4: x hand pa left · 0.14mm/px · 3 of 3 slices shown]
[im 1/3]
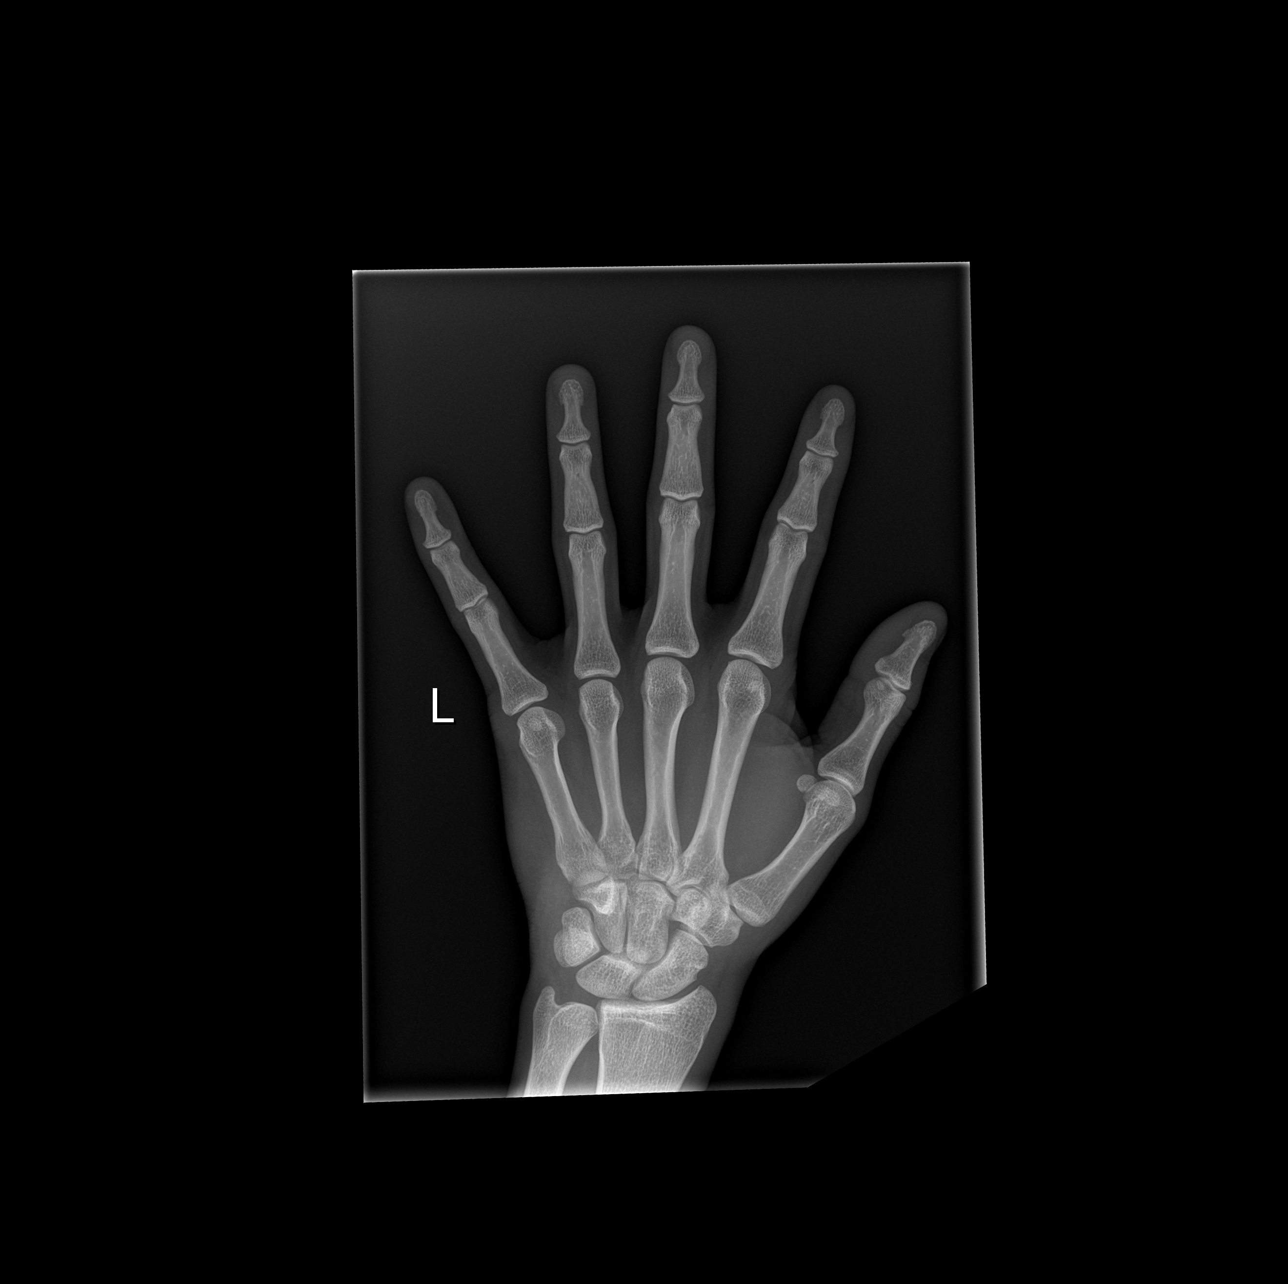
[im 2/3]
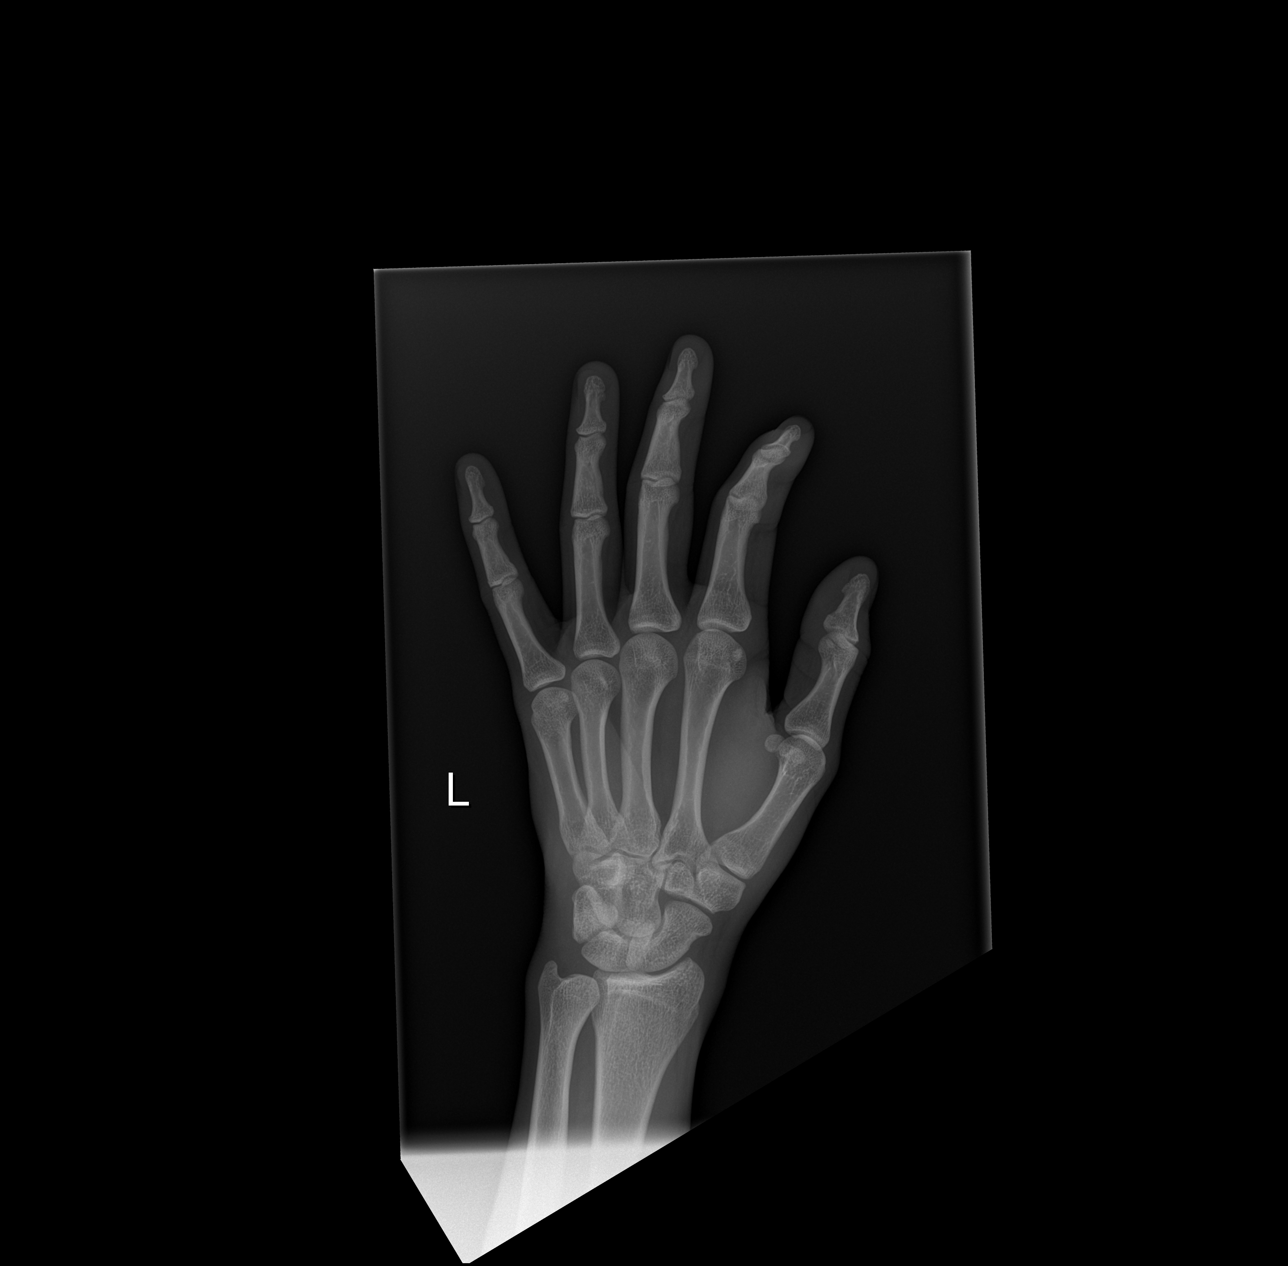
[im 3/3]
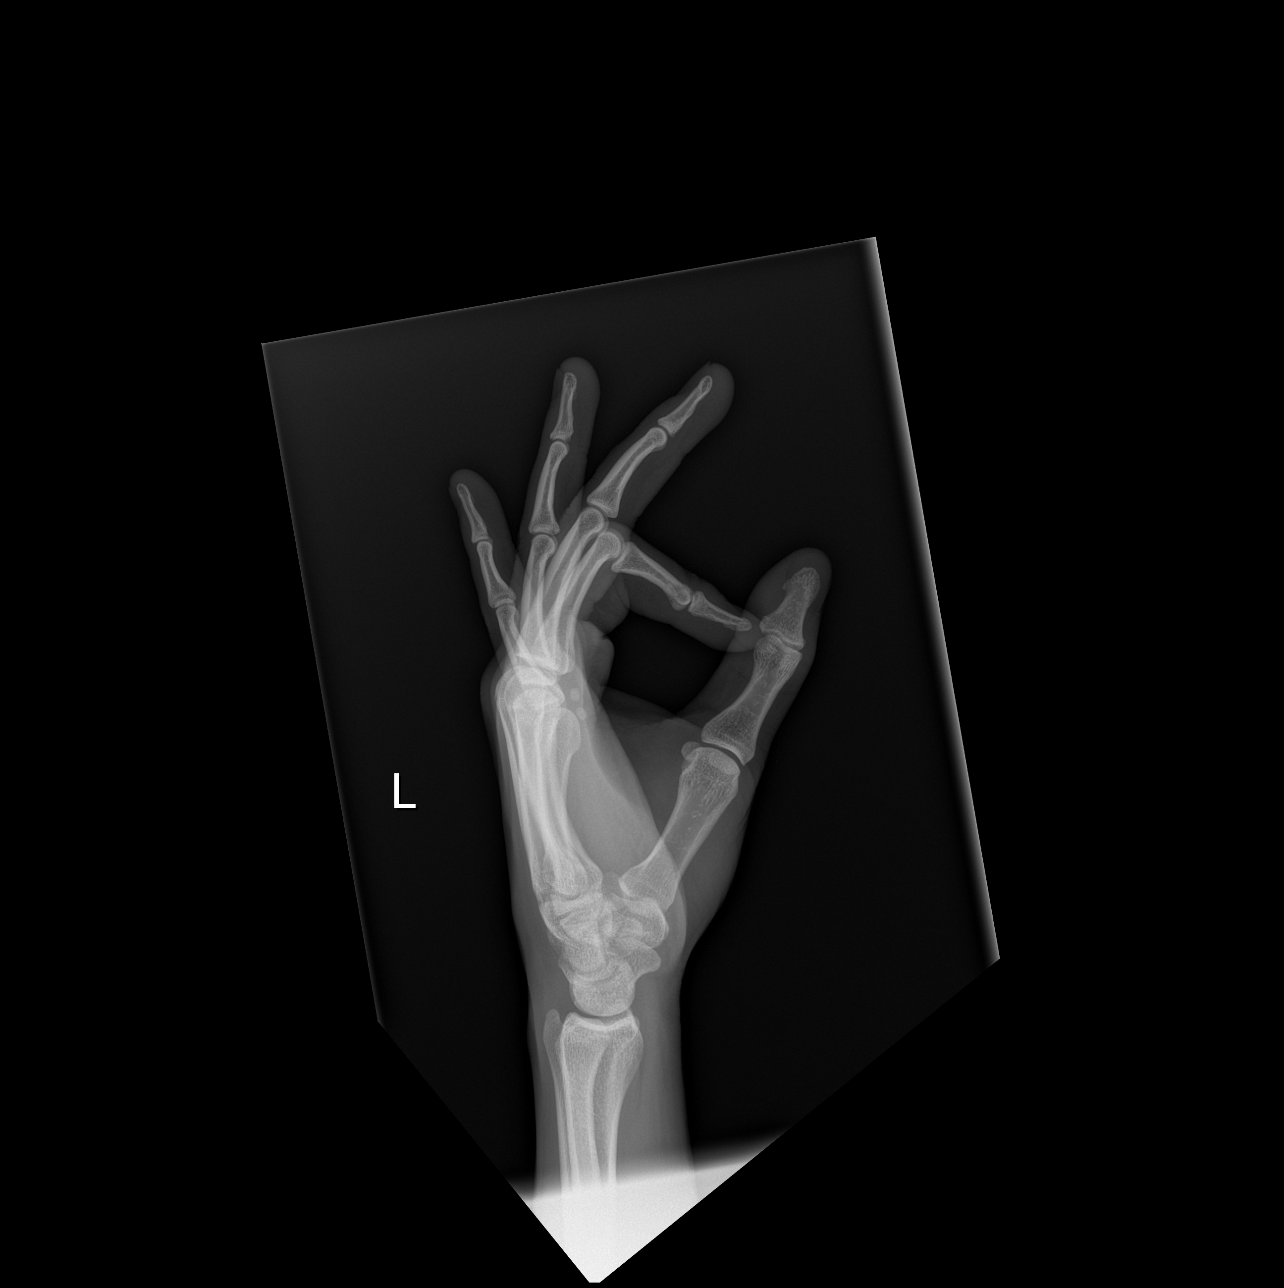

[3 of 3 positions shown; findings below may reference images not displayed]

FINDINGS: There is no evidence of fracture or dislocation. There is no
evidence of arthropathy or other focal bone abnormality. Soft
tissues are unremarkable.
IMPRESSION: Negative.

## 2022-06-22 ENCOUNTER — Ambulatory Visit
Admission: EM | Admit: 2022-06-22 | Discharge: 2022-06-22 | Disposition: A | Discharge status: Home / Self Care | Payer: Medicaid Other | Attending: Urgent Care | Admitting: Urgent Care

## 2022-06-22 DIAGNOSIS — Z113 Encounter for screening for infections with a predominantly sexual mode of transmission: Secondary | ICD-10-CM

## 2022-06-22 DIAGNOSIS — R6889 Other general symptoms and signs: Secondary | ICD-10-CM | POA: Diagnosis present

## 2022-06-22 DIAGNOSIS — J45901 Unspecified asthma with (acute) exacerbation: Secondary | ICD-10-CM | POA: Diagnosis present

## 2022-06-22 DIAGNOSIS — R4689 Other symptoms and signs involving appearance and behavior: Secondary | ICD-10-CM | POA: Insufficient documentation

## 2022-06-22 MED ORDER — VENTOLIN HFA 108 (90 BASE) MCG/ACT IN AERS: 1.0000 | INHALATION_SPRAY | Freq: Four times a day (QID) | RESPIRATORY_TRACT | 0 refills | Status: AC | PRN

## 2022-06-22 MED ORDER — PREDNISONE 20 MG PO TABS: ORAL_TABLET | ORAL | 0 refills | Status: AC

## 2022-06-22 NOTE — ED Provider Notes (Signed)
Alex Little    CSN: [id number] Arrival date & time: 06/22/22  1343      History   Chief Complaint Chief Complaint  Patient presents with   Cough   Nasal Congestion    HPI Alex Little is a 22 y.o. male.    Cough   Presents to urgent care with complaint of symptoms since Thursday (5 days).  He endorses cough, chills, sweats, nasal congestion.  He states he is using TheraFlu and emergent-C for his symptoms.  PMH includes asthma and patient requesting refill of albuterol inhaler.  Patient endorses being cigarette and marijuana user.  Of TheraFlu was yesterday.  He denies measured fever.  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 No pertinent surgical history.     Home Medications    Prior to Admission medications   Medication Sig Start Date End Date Taking? Authorizing Provider  ARIPiprazole (ABILIFY) 5 MG tablet Take 5 mg by mouth daily.    [provider]  brompheniramine-pseudoephedrine-DM 30-2-10 MG/5ML syrup Take 5 mLs by mouth 4 (four) times daily as needed. 10/14/21   Mar Daring, PA-C  cetirizine (ZYRTEC) 10 MG tablet Take 10 mg by mouth daily as needed.     [provider]  COVID-19 At-Home Test KIT Use as directed on package instructions 10/14/21   Mar Daring, PA-C  divalproex (DEPAKOTE) 250 MG DR tablet Take 500 mg by mouth 2 (two) times daily.    [provider]  Guaifenesin (MUCINEX MAXIMUM STRENGTH) 1200 MG TB12 Take 1 tablet (1,200 mg total) by mouth every 12 (twelve)  hours as needed. Patient taking differently: Take 1 tablet by mouth every 12 (twelve) hours as needed (congestion). 08/01/17   Maczis, Barth Kirks, PA-C  ibuprofen (ADVIL) 600 MG tablet Take 1 tablet (600 mg total) by mouth every 8 (eight) hours as needed. 09/07/21   Perlie Mayo, NP  ipratropium (ATROVENT) 0.03 % nasal spray Place 2 sprays into both nostrils every 12 (twelve) hours. 10/14/21   Mar Daring, PA-C  Methylphenidate HCl (CONCERTA PO) Take 1 tablet by mouth daily.     [provider]  traZODone (DESYREL) 100 MG tablet Take 100 mg by mouth at bedtime. 09/16/17   [provider]    Family History Family History  Problem Relation Age of Onset   Depression Mother    Depression Father    Schizophrenia Father     Social History Social History   Tobacco Use   Smoking status: Every Day    Packs/day: 0.25    Types: Cigarettes   Smokeless tobacco: Never  Vaping Use   Vaping Use: Some days   Substances: Nicotine, THC, Flavoring  Substance Use Topics   Alcohol use: Yes    Comment: soc.   Drug use: Yes    Frequency: 7.0 times per week    Types: Marijuana     Allergies   Patient has no known allergies.   Review of Systems Review of Systems  Respiratory:  Positive for  cough.      Physical Exam Triage Vital Signs ED Triage Vitals  Enc Vitals Group     BP 06/22/22 1403 106/72     Pulse Rate 06/22/22 1403 77     Resp 06/22/22 1403 16     Temp 06/22/22 1403 (!) 97.5 F (36.4 C)     Temp Source 06/22/22 1403 Temporal     SpO2 06/22/22 1403 97 %     Weight --      Height --      Head Circumference --      Peak Flow --      Pain Score 06/22/22 1402 0     Pain Loc --      Pain Edu? --      Excl. in [place]? --    No data found.  Updated Vital Signs BP 106/72 (BP Location: Left Arm)   Pulse 77   Temp (!) 97.5 F (36.4 C) (Temporal)   Resp 16   SpO2 97%   Visual Acuity Right Eye Distance:   Left Eye Distance:   Bilateral Distance:     Right Eye Near:   Left Eye Near:    Bilateral Near:     Physical Exam Vitals reviewed.  Constitutional:      Appearance: Normal appearance. He is not ill-appearing.  Cardiovascular:     Rate and Rhythm: Normal rate and regular rhythm.     Pulses: Normal pulses.     Heart sounds: Normal heart sounds.  Pulmonary:     Effort: Pulmonary effort is normal.     Breath sounds: Wheezing present.  Skin:    General: Skin is warm and dry.  Neurological:     General: No focal deficit present.     Mental Status: He is alert and oriented to person, place, and time.  Psychiatric:        Mood and Affect: Mood normal.        Behavior: Behavior normal.      UC Treatments / Results  Labs (all labs ordered are listed, but only abnormal results are displayed) Labs Reviewed - No data to display  EKG   Radiology No results found.  Procedures Procedures (including critical care time)  Medications Ordered in UC Medications - No data to display  Initial Impression / Assessment and Plan / UC Course  I have reviewed the triage vital signs and the nursing notes.  Pertinent labs & imaging results that were available during my care of the patient were reviewed by me and considered in my medical decision making (see chart for details).   Patient is afebrile here without recent antipyretics. Satting well on room air. Overall is well appearing, well hydrated, without respiratory distress. Pulmonary exam is remarkable for wheezing in the left upper lobe.  Symptoms are consistent with acute viral process with exacerbation of asthma given his wheezing.  Patient is current user of tobacco and cannabis product and recommended that this was making his symptoms worse and suggested he stop using this for the time being.  Will treat asthma exacerbation with a course of prednisone.  Providing albuterol refill.  Also recommending continued use of OTC medication for symptom control including  Tylenol/ibuprofen for fever/chills/body aches.  Final Clinical Impressions(s) / UC Diagnoses   Final diagnoses:  None   Discharge Instructions   None    ED Prescriptions   None    PDMP not reviewed this encounter.   Rose Phi, Rule 06/22/22 1413

## 2022-06-22 NOTE — Discharge Instructions (Addendum)
You have been diagnosed with a viral upper respiratory infection based on your symptoms and exam. Viral illnesses cannot be treated with antibiotics - they are self limiting - and you should find your symptoms resolving within a few days. Get plenty of rest and non-caffeinated fluids. Watch for signs of dehydration including reduced urine output and dark colored urine.  We recommend you use over-the-counter medications for symptom control including acetaminophen (Tylenol), ibuprofen (Advil/Motrin) or naproxen (Aleve) for throat pain, fever, chills or body aches. You may combine use of acetaminophen and ibuprofen/naproxen if needed.  Some patients find an pain-relieving throat spray such as Chloraseptic to be effective.     Also recommend cold/cough medication containing a cough suppressant such as dextromethorphan, as needed.  Saline mist spray is helpful for removing excess mucus from your nose.  Room humidifiers are helpful to ease breathing at night. I recommend guaifenesin (Mucinex) with plenty of water throughout the day to help thin and loosen mucus secretions in your respiratory passages.   If appropriate based upon your other medical problems, you might also find relief of nasal/sinus congestion symptoms by using a nasal decongestant such as fluticasone (Flonase ) or pseudoephedrine (Sudafed sinus).  You will need to obtain Sudafed from behind the pharmacist counter.  Speak to the pharmacist to verify that you are not duplicating medications with other over-the-counter formulations that you may be using.   

## 2022-06-22 NOTE — ED Triage Notes (Signed)
Patient presents to UC for cough, chills, sweats, and congestion since Thursday,. Taking Theraflu and emergenc. Hx of asthma. Needs albuterol refill.

## 2022-06-23 ENCOUNTER — Telehealth (HOSPITAL_COMMUNITY): Payer: Self-pay | Admitting: Emergency Medicine

## 2022-06-23 LAB — CYTOLOGY, (ORAL, ANAL, URETHRAL) ANCILLARY ONLY
Chlamydia: POSITIVE — AB
Comment: NEGATIVE
Comment: NORMAL
Neisseria Gonorrhea: NEGATIVE

## 2022-06-23 MED ORDER — DOXYCYCLINE HYCLATE 100 MG PO CAPS: 100.0000 mg | ORAL_CAPSULE | Freq: Two times a day (BID) | ORAL | 0 refills | Status: AC

## 2022-08-14 ENCOUNTER — Ambulatory Visit (HOSPITAL_COMMUNITY)
Admission: EM | Admit: 2022-08-14 | Discharge: 2022-08-14 | Disposition: A | Discharge status: Home / Self Care | Payer: Medicaid Other | Attending: Physician Assistant | Admitting: Physician Assistant

## 2022-08-14 ENCOUNTER — Encounter (HOSPITAL_COMMUNITY): Payer: Self-pay

## 2022-08-14 DIAGNOSIS — K047 Periapical abscess without sinus: Secondary | ICD-10-CM | POA: Diagnosis not present

## 2022-08-14 DIAGNOSIS — K0889 Other specified disorders of teeth and supporting structures: Secondary | ICD-10-CM

## 2022-08-14 MED ORDER — AMOXICILLIN-POT CLAVULANATE 875-125 MG PO TABS: 1.0000 | ORAL_TABLET | Freq: Two times a day (BID) | ORAL | 0 refills | Status: AC

## 2022-08-14 MED ORDER — HYDROCODONE-ACETAMINOPHEN 5-325 MG PO TABS: 1.0000 | ORAL_TABLET | Freq: Every day | ORAL | 0 refills | Status: DC | PRN

## 2022-08-14 NOTE — ED Provider Notes (Signed)
[name]    CSN: [id number] Arrival date & time: 08/14/22  1642      History   Chief Complaint Chief Complaint  Patient presents with   Dental Pain    HPI Alex Little is a 22 y.o. male.   Patient presents today with a 1 week history of worsening right lower jaw pain.  Reports pain is rated 10 on a 0-10 pain scale, described as throbbing, worse with mastication, no alleviating factors identified.  Denies episodes of similar symptoms in the past.  He denies any recent dental procedures.  He does not currently follow with a dentist.  He has tried high doses of ibuprofen as well as Tylenol and Orajel without improvement of symptoms.  He is having trouble eating as result of the pain but denies any dysphagia, swelling of his throat, shortness of breath, muffled voice, fever, nausea, vomiting.  Denies any recent antibiotic use.    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 No pertinent surgical history.     Home Medications    Prior to Admission medications   Medication Sig Start Date End Date Taking? Authorizing Provider  amoxicillin-clavulanate (AUGMENTIN) 875-125 MG tablet Take 1 tablet by mouth every 12 (twelve) hours. 08/14/22  Yes Adarrius Graeff, Derry Skill, PA-C  HYDROcodone-acetaminophen (NORCO/VICODIN) 5-325 MG tablet Take 1 tablet by mouth daily as needed for up to 3 days. 08/14/22 08/17/22 Yes Darren Nodal K, PA-C  ibuprofen (ADVIL) 600 MG tablet Take 1 tablet (600 mg total) by mouth every 8 (eight) hours as needed. 09/07/21  Yes Perlie Mayo, NP  VENTOLIN HFA 108 (90 Base) MCG/ACT inhaler Inhale 1-2 puffs into the lungs every 6 (six) hours as needed for wheezing or shortness of breath. 06/22/22  Yes Immordino, Annie Main, FNP  cetirizine (ZYRTEC) 10 MG tablet Take 10 mg by mouth daily as needed.     [provider]  COVID-19 At-Home Test KIT Use as directed on package instructions 10/14/21   Mar Daring, PA-C    Family History Family History  Problem Relation Age of Onset   Depression Mother    Depression Father    Schizophrenia Father     Social History Social History   Tobacco Use   Smoking status: Every Day    Packs/day: 0.25    Types: Cigarettes   Smokeless tobacco: Never  Vaping Use   Vaping Use: Some days   Substances: Nicotine, THC, Flavoring  Substance Use Topics   Alcohol use: Yes    Comment: soc.   Drug use: Yes    Frequency: 7.0 times per week    Types: Marijuana     Allergies   Patient has no known allergies.   Review of Systems Review of Systems  Constitutional:  Positive for activity change. Negative for appetite change, fatigue and fever.  HENT:  Positive for dental problem. Negative for congestion, sinus pressure, sneezing, sore throat, trouble swallowing and voice change.   Respiratory:  Negative for cough and shortness of breath.   Cardiovascular:  Negative for chest pain.  Gastrointestinal:  Negative for abdominal pain, diarrhea, nausea and vomiting.     Physical Exam Triage Vital Signs ED Triage Vitals  Enc Vitals Group     BP 08/14/22 1740 116/71     Pulse Rate 08/14/22 1740 (!) 105     Resp 08/14/22 1740 18     Temp 08/14/22 1740 98.8 F (37.1 C)     Temp Source 08/14/22 1740 Oral     SpO2 08/14/22 1740 95 %     Weight 08/14/22 1739 130 lb (59 kg)     Height 08/14/22 1739 5' 7.5" (1.715 m)     Head Circumference --      Peak Flow --      Pain Score 08/14/22 1738 10     Pain Loc --      Pain Edu? --      Excl. in [place]? --    No data found.  Updated  Vital Signs BP 116/71 (BP Location: Right Arm)   Pulse 92   Temp 98.8 F (37.1 C) (Oral)   Resp 18   Ht 5' 7.5" (1.715 m)   Wt 130 lb (59 kg)   SpO2 95%   BMI 20.06 kg/m   Visual Acuity Right Eye Distance:   Left Eye Distance:   Bilateral Distance:    Right Eye Near:   Left Eye Near:    Bilateral Near:     Physical Exam Vitals reviewed.  Constitutional:      General: He is awake.     Appearance: Normal appearance. He is well-developed. He is not ill-appearing.     Comments: Very pleasant male appears stated age in no acute distress sitting comfortably in exam room  HENT:     Head: Normocephalic and atraumatic.     Right Ear: External ear normal.     Left Ear: External ear normal.     Nose: Nose normal.     Mouth/Throat:     Dentition: Abnormal dentition. Gingival swelling and dental abscesses present.     Pharynx: Uvula midline. Posterior oropharyngeal erythema present. No oropharyngeal exudate.      Comments: No evidence of Ludwig angina Cardiovascular:     Rate and Rhythm: Normal rate and regular rhythm.     Heart sounds: Normal heart sounds, S1 normal and S2 normal. No murmur heard. Pulmonary:     Effort: Pulmonary effort is normal. No accessory muscle usage or respiratory distress.     Breath sounds: Normal breath sounds. No stridor. No wheezing, rhonchi or rales.     Comments: Clear to auscultation bilaterally Lymphadenopathy:     Head:     Right side of head: No submental, submandibular or tonsillar adenopathy.     Left side of head: No submental, submandibular or tonsillar adenopathy.     Cervical: No cervical adenopathy.  Neurological:     Mental Status: He is alert.  Psychiatric:        Behavior: Behavior is cooperative.      UC Treatments / Results  Labs (all labs ordered are listed, but only abnormal results are displayed) Labs Reviewed - No data to display  EKG   Radiology No results found.  Procedures Procedures (including critical  care time)  Medications Ordered in UC Medications - No data to display  Initial Impression / Assessment and Plan / UC Course  I have reviewed the triage vital signs and the nursing notes.  Pertinent labs & imaging results that were  available during my care of the patient were reviewed by me and considered in my medical decision making (see chart for details).     Patient is well-appearing, afebrile, nontoxic, nontachycardic; patient was initially tachycardic but this resolved after sitting quietly for several minutes.  No indication for emergent evaluation or imaging.  Patient was treated for dental infection with Augmentin twice daily for 10 days.  He was encouraged to continue over-the-counter ibuprofen and Tylenol for pain relief.  He reports that he has been using high doses of these medications (about 4 days generally prescribed) without improvement of symptoms.  He was given a few doses of hydrocodone and we discussed that this can be sedating and is addictive so he should use it as sparingly as possible.  Review of [place] controlled substance database shows no inappropriate refills.  Recommend he gargle with warm salt water for additional symptom relief.  Discussed that ultimately he will need to see dentist to address underlying tooth.  He was provided low-cost dental resources in the area with after visit summary.  If he develops any worsening symptoms including fever, nausea, vomiting, swelling of his throat, muffled voice, dysphagia he needs to go to the emergency room to which he expressed understanding.    Final Clinical Impressions(s) / UC Diagnoses   Final diagnoses:  Dental infection  Pain, dental     Discharge Instructions      Start Augmentin twice daily for 7 days.  Use over-the-counter Tylenol ibuprofen for pain.  I have called in 3 doses of hydrocodone.  Do not drive or drink alcohol with taking this medication.  This medication can have a lot of side effects  and can be addictive so use it as little as possible.  Gargle with warm salt water for additional symptom relief.  You should follow-up with dentist; call to schedule an appointment.  If you develop any worsening symptoms including difficulty swallowing, difficulty speaking, swelling of your throat, high fever, change in your voice you need to be seen immediately.      ED Prescriptions     Medication Sig Dispense Auth. Provider   HYDROcodone-acetaminophen (NORCO/VICODIN) 5-325 MG tablet Take 1 tablet by mouth daily as needed for up to 3 days. 3 tablet Rual Vermeer K, PA-C   amoxicillin-clavulanate (AUGMENTIN) 875-125 MG tablet Take 1 tablet by mouth every 12 (twelve) hours. 20 tablet Rossie Scarfone K, PA-C      I have reviewed the PDMP during this encounter.   Terrilee Croak, PA-C 08/14/22 1818

## 2022-08-14 NOTE — Discharge Instructions (Signed)
Start Augmentin twice daily for 7 days.  Use over-the-counter Tylenol ibuprofen for pain.  I have called in 3 doses of hydrocodone.  Do not drive or drink alcohol with taking this medication.  This medication can have a lot of side effects and can be addictive so use it as little as possible.  Gargle with warm salt water for additional symptom relief.  You should follow-up with dentist; call to schedule an appointment.  If you develop any worsening symptoms including difficulty swallowing, difficulty speaking, swelling of your throat, high fever, change in your voice you need to be seen immediately.

## 2022-08-14 NOTE — ED Triage Notes (Signed)
Patient here today for having mouth and gum pain X 1 week. Most of the pain is on the right side but it feels like it may be spreading to the other side. Taking IBU and Orajel which help just a little.

## 2022-08-15 ENCOUNTER — Telehealth (HOSPITAL_COMMUNITY): Payer: Self-pay | Admitting: Physician Assistant

## 2022-08-15 MED ORDER — HYDROCODONE-ACETAMINOPHEN 5-325 MG PO TABS: 1.0000 | ORAL_TABLET | Freq: Every day | ORAL | 0 refills | Status: DC | PRN

## 2022-08-15 MED ORDER — HYDROCODONE-ACETAMINOPHEN 5-325 MG PO TABS: 1.0000 | ORAL_TABLET | Freq: Every day | ORAL | 0 refills | Status: AC | PRN

## 2022-08-15 NOTE — Telephone Encounter (Signed)
Patient was seen yesterday (08/14/2022) for dental pain.  Prescription for 3 doses of hydrocodone was sent to pharmacy.  Received a call from [facility] today (08/15/2022) stating that the address and fax number was not included on the prescription and they needed this to be resent to include this information to be in compliance with the pharmacy board.  This was resent electronically with this information noted in the note to pharmacy section of the prescription.
